# Patient Record
Sex: Female | Born: 1950 | Race: White | Hispanic: No | Marital: Married | Smoking: Former smoker
Health system: Southern US, Community
[De-identification: ages and names within clinical notes are randomized; demographics above are authoritative.]

## PROBLEM LIST (undated history)

## (undated) DIAGNOSIS — T8859XA Other complications of anesthesia, initial encounter: Secondary | ICD-10-CM

## (undated) HISTORY — PX: TONSILLECTOMY: SUR1361

## (undated) HISTORY — PX: OTHER SURGICAL HISTORY: SHX169

---

## 2020-12-01 ENCOUNTER — Other Ambulatory Visit (HOSPITAL_COMMUNITY): Payer: Self-pay | Admitting: Internal Medicine

## 2020-12-01 DIAGNOSIS — Z13820 Encounter for screening for osteoporosis: Secondary | ICD-10-CM

## 2020-12-01 DIAGNOSIS — Z1231 Encounter for screening mammogram for malignant neoplasm of breast: Secondary | ICD-10-CM

## 2020-12-13 ENCOUNTER — Other Ambulatory Visit: Payer: Self-pay

## 2020-12-13 ENCOUNTER — Ambulatory Visit (HOSPITAL_COMMUNITY)
Admission: RE | Admit: 2020-12-13 | Discharge: 2020-12-13 | Disposition: A | Discharge status: Home / Self Care | Payer: Medicare Other | Source: Ambulatory Visit | Attending: Internal Medicine | Admitting: Internal Medicine

## 2020-12-13 DIAGNOSIS — Z780 Asymptomatic menopausal state: Secondary | ICD-10-CM | POA: Insufficient documentation

## 2020-12-13 DIAGNOSIS — Z1231 Encounter for screening mammogram for malignant neoplasm of breast: Secondary | ICD-10-CM | POA: Diagnosis not present

## 2020-12-13 DIAGNOSIS — Z13820 Encounter for screening for osteoporosis: Secondary | ICD-10-CM

## 2020-12-13 DIAGNOSIS — M810 Age-related osteoporosis without current pathological fracture: Secondary | ICD-10-CM | POA: Diagnosis not present

## 2021-01-04 ENCOUNTER — Inpatient Hospital Stay
Admission: RE | Admit: 2021-01-04 | Discharge: 2021-01-04 | Disposition: A | Discharge status: Home / Self Care | Payer: Self-pay | Source: Ambulatory Visit | Attending: Internal Medicine | Admitting: Internal Medicine

## 2021-01-04 ENCOUNTER — Other Ambulatory Visit (HOSPITAL_COMMUNITY): Payer: Self-pay | Admitting: Internal Medicine

## 2021-01-04 DIAGNOSIS — Z1231 Encounter for screening mammogram for malignant neoplasm of breast: Secondary | ICD-10-CM

## 2021-01-30 ENCOUNTER — Encounter (HOSPITAL_COMMUNITY)
Admission: RE | Admit: 2021-01-30 | Discharge: 2021-01-30 | Disposition: A | Discharge status: Home / Self Care | Payer: Medicare Other | Source: Ambulatory Visit | Attending: Internal Medicine | Admitting: Internal Medicine

## 2021-01-30 ENCOUNTER — Other Ambulatory Visit: Payer: Self-pay

## 2021-01-30 DIAGNOSIS — M810 Age-related osteoporosis without current pathological fracture: Secondary | ICD-10-CM | POA: Insufficient documentation

## 2021-01-30 MED ORDER — DENOSUMAB 60 MG/ML ~~LOC~~ SOSY: 60.0000 mg | PREFILLED_SYRINGE | Freq: Once | SUBCUTANEOUS | Status: DC

## 2021-01-30 MED ORDER — DENOSUMAB 60 MG/ML ~~LOC~~ SOSY
PREFILLED_SYRINGE | SUBCUTANEOUS | Status: AC
  Administered 2021-01-30: 60 mg via SUBCUTANEOUS
  Filled 2021-01-30: qty 1

## 2021-01-30 NOTE — Discharge Instructions (Signed)
Information literature given to patient , along with consent obtained.

## 2021-06-27 ENCOUNTER — Encounter: Payer: Self-pay | Admitting: Internal Medicine

## 2021-07-31 ENCOUNTER — Other Ambulatory Visit: Payer: Self-pay

## 2021-07-31 ENCOUNTER — Ambulatory Visit (INDEPENDENT_AMBULATORY_CARE_PROVIDER_SITE_OTHER): Payer: Self-pay | Admitting: *Deleted

## 2021-07-31 VITALS — Ht 64.0 in | Wt 130.6 lb

## 2021-07-31 DIAGNOSIS — Z86010 Personal history of colonic polyps: Secondary | ICD-10-CM

## 2021-07-31 NOTE — Progress Notes (Signed)
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

## 2021-07-31 NOTE — Progress Notes (Addendum)
Gastroenterology Pre-Procedure Review  Request Date: 07/31/2021 Requesting Physician: Valentino Nose, NP, Last TCS done 12/19/2016 at [facility], internal hemorrhoids, tubular adenoma, 5 year repeat recommended  PATIENT REVIEW QUESTIONS: The patient responded to the following health history questions as indicated:    1. Diabetes Melitis: no 2. Joint replacements in the past 12 months: no 3. Major health problems in the past 3 months: no 4. Has an artificial valve or MVP: no 5. Has a defibrillator: no 6. Has been advised in past to take antibiotics in advance of a procedure like teeth cleaning: no 7. Family history of colon cancer: no  8. Alcohol Use: yes, 1 beer daily 9. Illicit drug Use: no 10. History of sleep apnea: no  11. History of coronary artery or other vascular stents placed within the last 12 months: no 12. History of any prior anesthesia complications: yes, fever next day and sedates easily 13. Body mass index is 22.42 kg/m.    MEDICATIONS & ALLERGIES:    Patient reports the following regarding taking any blood thinners:   Plavix? no Aspirin? Yes, 81 mg Coumadin? no Brilinta? no Xarelto? no Eliquis? no Pradaxa? no Savaysa? no Effient? no  Patient confirms/reports the following medications:  Current Outpatient Medications  Medication Sig Dispense Refill   aspirin EC 81 MG tablet Take 81 mg by mouth daily. Swallow whole.     b complex vitamins capsule Take 1 capsule by mouth daily.     Bempedoic Acid-Ezetimibe (NEXLIZET) 180-10 MG TABS Take 1 tablet by mouth daily.     Calcium Carb-Cholecalciferol (CALCIUM 600 + D PO) Take by mouth daily at 6 (six) AM.     cholecalciferol (VITAMIN D3) 25 MCG (1000 UNIT) tablet Take 1,000 Units by mouth daily.     denosumab (PROLIA) 60 MG/ML SOSY injection Inject 60 mg into the skin every 6 (six) months.     No current facility-administered medications for this visit.    Patient confirms/reports the  following allergies:  Allergies  Allergen Reactions   Compazine [Prochlorperazine] Other (See Comments)    Passed out    No orders of the defined types were placed in this encounter.   AUTHORIZATION INFORMATION Primary Insurance: Medicare,  ID #: [id number] Pre-Cert / Auth required: No, not required  Secondary Insurance: [place],  [place] #: [id number] Pre-Cert / Josem Kaufmann required: No, not required  SCHEDULE INFORMATION: Procedure has been scheduled as follows:  Date: 09/14/2021, Time:  9:00 Location: [facility] with Dr. Abbey Chatters  This Gastroenterology Pre-Precedure Review Form is being routed to the following provider(s): Neil Crouch, PA-C

## 2021-07-31 NOTE — Progress Notes (Signed)
Pt is requesting to have Miralax prep.

## 2021-08-01 ENCOUNTER — Encounter (HOSPITAL_COMMUNITY): Payer: Self-pay

## 2021-08-01 ENCOUNTER — Encounter (HOSPITAL_COMMUNITY)
Admission: RE | Admit: 2021-08-01 | Discharge: 2021-08-01 | Disposition: A | Discharge status: Home / Self Care | Payer: Medicare Other | Source: Ambulatory Visit | Attending: Internal Medicine | Admitting: Internal Medicine

## 2021-08-01 DIAGNOSIS — M810 Age-related osteoporosis without current pathological fracture: Secondary | ICD-10-CM | POA: Diagnosis present

## 2021-08-01 MED ORDER — DENOSUMAB 60 MG/ML ~~LOC~~ SOSY
60.0000 mg | PREFILLED_SYRINGE | Freq: Once | SUBCUTANEOUS | Status: AC
  Administered 2021-08-01: 08:00:00 60 mg via SUBCUTANEOUS
  Filled 2021-08-01: qty 1

## 2021-08-07 NOTE — Progress Notes (Signed)
Ideally would choose FDA approved bowel prep for colonoscopy. See if she is willing to do Clenpiq, maybe given her a sample.

## 2021-08-08 NOTE — Progress Notes (Signed)
Ok to schedule.  ASA II 

## 2021-08-09 NOTE — Progress Notes (Signed)
Lmom for pt to call me back. 

## 2021-08-21 ENCOUNTER — Telehealth: Payer: Self-pay | Admitting: *Deleted

## 2021-08-21 NOTE — Telephone Encounter (Signed)
PATIENT RETURNED YOUR CALL

## 2021-08-23 ENCOUNTER — Encounter: Payer: Self-pay | Admitting: *Deleted

## 2021-08-23 NOTE — Progress Notes (Signed)
Records release was faxed over on 07/31/2021.  Called and left voice message for medical records to follow up.

## 2021-08-23 NOTE — Telephone Encounter (Signed)
Spoke to pt.  She scheduled procedure for 09/14/2021 with arrival at 7:30.  Pt agreeable to Clenpiq.  Pt is coming by office to get kit and instructions today. °

## 2021-08-23 NOTE — Progress Notes (Signed)
Spoke to pt.  She scheduled procedure for 09/14/2021 with arrival at 7:30.  Pt agreeable to Clenpiq.  Pt is coming by office to get kit and instructions today. °

## 2021-08-27 NOTE — Progress Notes (Signed)
Received records and updated information in triage.  Will route to provider who signed off for review.

## 2021-09-03 NOTE — Progress Notes (Signed)
Updated info noted. Ok to proceed as before.

## 2021-09-14 ENCOUNTER — Encounter (HOSPITAL_COMMUNITY)
Admission: RE | Disposition: A | Discharge status: Home / Self Care | Payer: Self-pay | Source: Home / Self Care | Attending: Internal Medicine

## 2021-09-14 ENCOUNTER — Encounter (HOSPITAL_COMMUNITY): Payer: Self-pay

## 2021-09-14 ENCOUNTER — Ambulatory Visit (HOSPITAL_COMMUNITY)
Admission: RE | Admit: 2021-09-14 | Discharge: 2021-09-14 | Disposition: A | Discharge status: Home / Self Care | Payer: Medicare Other | Attending: Internal Medicine | Admitting: Internal Medicine

## 2021-09-14 ENCOUNTER — Ambulatory Visit (HOSPITAL_COMMUNITY): Payer: Medicare Other | Admitting: Anesthesiology

## 2021-09-14 ENCOUNTER — Other Ambulatory Visit: Payer: Self-pay

## 2021-09-14 DIAGNOSIS — K635 Polyp of colon: Secondary | ICD-10-CM | POA: Diagnosis not present

## 2021-09-14 DIAGNOSIS — Z09 Encounter for follow-up examination after completed treatment for conditions other than malignant neoplasm: Secondary | ICD-10-CM | POA: Diagnosis not present

## 2021-09-14 DIAGNOSIS — Q438 Other specified congenital malformations of intestine: Secondary | ICD-10-CM | POA: Insufficient documentation

## 2021-09-14 DIAGNOSIS — K648 Other hemorrhoids: Secondary | ICD-10-CM | POA: Insufficient documentation

## 2021-09-14 DIAGNOSIS — Z87891 Personal history of nicotine dependence: Secondary | ICD-10-CM | POA: Insufficient documentation

## 2021-09-14 DIAGNOSIS — Z86010 Personal history of colonic polyps: Secondary | ICD-10-CM

## 2021-09-14 DIAGNOSIS — D123 Benign neoplasm of transverse colon: Secondary | ICD-10-CM | POA: Insufficient documentation

## 2021-09-14 HISTORY — PX: COLONOSCOPY WITH PROPOFOL: SHX5780

## 2021-09-14 HISTORY — PX: POLYPECTOMY: SHX5525

## 2021-09-14 HISTORY — DX: Other complications of anesthesia, initial encounter: T88.59XA

## 2021-09-14 SURGERY — COLONOSCOPY WITH PROPOFOL
Anesthesia: General

## 2021-09-14 MED ORDER — LACTATED RINGERS IV SOLN: INTRAVENOUS | Status: DC

## 2021-09-14 MED ORDER — CHLORHEXIDINE GLUCONATE CLOTH 2 % EX PADS: 6.0000 | MEDICATED_PAD | Freq: Once | CUTANEOUS | Status: DC

## 2021-09-14 MED ORDER — LIDOCAINE HCL (CARDIAC) PF 100 MG/5ML IV SOSY
PREFILLED_SYRINGE | INTRAVENOUS | Status: DC | PRN
  Administered 2021-09-14: 50 mg via INTRAVENOUS

## 2021-09-14 MED ORDER — PROPOFOL 10 MG/ML IV BOLUS
INTRAVENOUS | Status: DC | PRN
Start: 2021-09-14 — End: 2021-09-14
  Administered 2021-09-14: 100 mg via INTRAVENOUS

## 2021-09-14 MED ORDER — PROPOFOL 500 MG/50ML IV EMUL
INTRAVENOUS | Status: DC | PRN
  Administered 2021-09-14: 150 ug/kg/min via INTRAVENOUS

## 2021-09-14 NOTE — Anesthesia Preprocedure Evaluation (Signed)
Anesthesia Evaluation  Patient identified by MRN, date of birth, ID band Patient awake    Reviewed: Allergy & Precautions, H&P , NPO status , Patient's Chart, lab work & pertinent test results, reviewed documented beta blocker date and time   Airway Mallampati: II  TM Distance: >3 FB Neck ROM: full    Dental no notable dental hx.    Pulmonary neg pulmonary ROS, former smoker,    Pulmonary exam normal breath sounds clear to auscultation       Cardiovascular Exercise Tolerance: Good negative cardio ROS   Rhythm:regular Rate:Normal     Neuro/Psych negative neurological ROS  negative psych ROS   GI/Hepatic negative GI ROS, Neg liver ROS,   Endo/Other  negative endocrine ROS  Renal/GU negative Renal ROS  negative genitourinary   Musculoskeletal   Abdominal   Peds  Hematology negative hematology ROS (+)   Anesthesia Other Findings   Reproductive/Obstetrics negative OB ROS                             Anesthesia Physical Anesthesia Plan  ASA: 2  Anesthesia Plan: General   Post-op Pain Management:    Induction:   PONV Risk Score and Plan: Propofol infusion  Airway Management Planned:   Additional Equipment:   Intra-op Plan:   Post-operative Plan:   Informed Consent: I have reviewed the patients History and Physical, chart, labs and discussed the procedure including the risks, benefits and alternatives for the proposed anesthesia with the patient or authorized representative who has indicated his/her understanding and acceptance.     Dental Advisory Given  Plan Discussed with: CRNA  Anesthesia Plan Comments:         Anesthesia Quick Evaluation  

## 2021-09-14 NOTE — Anesthesia Postprocedure Evaluation (Signed)
Anesthesia Post Note  Patient: Krista Cabrera  Procedure(s) Performed: COLONOSCOPY WITH PROPOFOL POLYPECTOMY  Patient location during evaluation: Phase II Anesthesia Type: General Level of consciousness: awake Pain management: pain level controlled Vital Signs Assessment: post-procedure vital signs reviewed and stable Respiratory status: spontaneous breathing and respiratory function stable Cardiovascular status: blood pressure returned to baseline and stable Postop Assessment: no headache and no apparent nausea or vomiting Anesthetic complications: no Comments: Late entry   No notable events documented.   Last Vitals:  Vitals:   09/14/21 0753 09/14/21 0934  BP: 135/79 114/62  Pulse: 74   Resp: 12 11  Temp: 36.4 C (!) 36.3 C  SpO2: 99% 98%    Last Pain:  Vitals:   09/14/21 0934  TempSrc: Oral  PainSc: 0-No pain                 Louann Sjogren

## 2021-09-14 NOTE — Discharge Instructions (Addendum)
°  Colonoscopy Discharge Instructions  Read the instructions outlined below and refer to this sheet in the next few weeks. These discharge instructions provide you with general information on caring for yourself after you leave the hospital. Your doctor may also give you specific instructions. While your treatment has been planned according to the most current medical practices available, unavoidable complications occasionally occur.   ACTIVITY You may resume your regular activity, but move at a slower pace for the next 24 hours.  Take frequent rest periods for the next 24 hours.  Walking will help get rid of the air and reduce the bloated feeling in your belly (abdomen).  No driving for 24 hours (because of the medicine (anesthesia) used during the test).   Do not sign any important legal documents or operate any machinery for 24 hours (because of the anesthesia used during the test).  NUTRITION Drink plenty of fluids.  You may resume your normal diet as instructed by your doctor.  Begin with a light meal and progress to your normal diet. Heavy or fried foods are harder to digest and may make you feel sick to your stomach (nauseated).  Avoid alcoholic beverages for 24 hours or as instructed.  MEDICATIONS You may resume your normal medications unless your doctor tells you otherwise.  WHAT YOU CAN EXPECT TODAY Some feelings of bloating in the abdomen.  Passage of more gas than usual.  Spotting of blood in your stool or on the toilet paper.  IF YOU HAD POLYPS REMOVED DURING THE COLONOSCOPY: No aspirin products for 7 days or as instructed.  No alcohol for 7 days or as instructed.  Eat a soft diet for the next 24 hours.  FINDING OUT THE RESULTS OF YOUR TEST Not all test results are available during your visit. If your test results are not back during the visit, make an appointment with your caregiver to find out the results. Do not assume everything is normal if you have not heard from your  caregiver or the medical facility. It is important for you to follow up on all of your test results.  SEEK IMMEDIATE MEDICAL ATTENTION IF: You have more than a spotting of blood in your stool.  Your belly is swollen (abdominal distention).  You are nauseated or vomiting.  You have a temperature over 101.  You have abdominal pain or discomfort that is severe or gets worse throughout the day.   Your colonoscopy revealed 1 polyp(s) which I removed successfully. Await pathology results, my office will contact you. I recommend repeating colonoscopy in 5 years for surveillance purposes. Otherwise follow up with GI as needed.    I hope you have a great rest of your week!  Charles K. Carver, D.O. Gastroenterology and Hepatology [facility]  

## 2021-09-14 NOTE — Op Note (Signed)
[facility] Patient Name: Krista Cabrera Procedure Date: 09/14/2021 8:58 AM MRN: [id number] Date of Birth: 1950/12/14 Attending MD: Elon Alas. Edgar Frisk CSN: [id number] Age: 71 Admit Type: Outpatient Procedure:                Colonoscopy Indications:              High risk colon cancer surveillance: Personal                            history of colonic polyps Providers:                Elon Alas. Abbey Chatters, DO, Lambert Mody, Hughie Closs RN, RN, Randa Spike, Technician Referring MD:              Medicines:                See the Anesthesia note for documentation of the                            administered medications Complications:            No immediate complications. Estimated Blood Loss:     Estimated blood loss was minimal. Procedure:                Pre-Anesthesia Assessment:                           - The anesthesia plan was to use monitored                            anesthesia care (MAC).                           After obtaining informed consent, the colonoscope                            was passed under direct vision. Throughout the                            procedure, the patient's blood pressure, pulse, and                            oxygen saturations were monitored continuously. The                            PCF-HQ190L ([id number]) scope was introduced through                            the anus and advanced to the the cecum, identified                            by appendiceal orifice and ileocecal valve. The                            colonoscopy was technically difficult  and complex                            due to a tortuous colon. The patient tolerated the                            procedure well. The quality of the bowel                            preparation was evaluated using the BBPS [facility]                            Bowel Preparation Scale) with scores of: Right                            Colon = 3, Transverse  Colon = 3 and Left Colon = 3                            (entire mucosa seen well with no residual staining,                            small fragments of stool or opaque liquid). The                            total BBPS score equals 9. Scope In: 9:09:50 AM Scope Out: 9:32:05 AM Scope Withdrawal Time: 0 hours 8 minutes 28 seconds  Total Procedure Duration: 0 hours 22 minutes 15 seconds  Findings:      The perianal and digital rectal examinations were normal.      Non-bleeding internal hemorrhoids were found during endoscopy.      A 2 mm polyp was found in the transverse colon. The polyp was sessile.       The polyp was removed with a cold biopsy forceps. Resection and       retrieval were complete.      The exam was otherwise without abnormality. Impression:               - Non-bleeding internal hemorrhoids.                           - One 2 mm polyp in the transverse colon, removed                            with a cold biopsy forceps. Resected and retrieved.                           - The examination was otherwise normal. Moderate Sedation:      Per Anesthesia Care Recommendation:           - Patient has a contact number available for                            emergencies. The signs and symptoms of potential  delayed complications were discussed with the                            patient. Return to normal activities tomorrow.                            Written discharge instructions were provided to the                            patient.                           - Resume previous diet.                           - Continue present medications.                           - Await pathology results.                           - Repeat colonoscopy in 5 years for surveillance.                           - Return to GI clinic PRN. Procedure Code(s):        --- Professional ---                           [phone], Colonoscopy, flexible; with biopsy, single                             or multiple Diagnosis Code(s):        --- Professional ---                           Z86.010, Personal history of colonic polyps                           K63.5, Polyp of colon                           K64.8, Other hemorrhoids CPT copyright 2019 American Medical Association. All rights reserved. The codes documented in this report are preliminary and upon coder review may  be revised to meet current compliance requirements. Elon Alas. Abbey Chatters, DO [place] Abbey Chatters, DO 09/14/2021 9:33:55 AM This report has been signed electronically. Number of Addenda: 0

## 2021-09-14 NOTE — H&P (Signed)
Primary Care Physician:  Celene Squibb, MD Primary Gastroenterologist:  Dr. Abbey Chatters  Pre-Procedure History & Physical: HPI:  Krista Cabrera is a 70 y.o. female is here for a colonoscopy to be performed for surveillance purposes. Last TCS done 12/19/2016 at [facility], internal hemorrhoids, tubular adenoma, 5 year repeat recommended.  Past Medical History:  Diagnosis Date   Complication of anesthesia     Past Surgical History:  Procedure Laterality Date   right carpal tunnel release     skin cancer removed from nose     TONSILLECTOMY      Prior to Admission medications   Medication Sig Start Date End Date Taking? Authorizing Provider  aspirin EC 81 MG tablet Take 81 mg by mouth daily. Swallow whole.   Yes [provider]  b complex vitamins capsule Take 1 capsule by mouth daily.   Yes [provider]  Bempedoic Acid-Ezetimibe (NEXLIZET) 180-10 MG TABS Take 1 tablet by mouth daily.   Yes [provider]  calcium carbonate (OS-CAL) 600 MG TABS tablet Take 600 mg by mouth daily.   Yes [provider]  cholecalciferol (VITAMIN D3) 25 MCG (1000 UNIT) tablet Take 1,000 Units by mouth daily.   Yes [provider]  magnesium gluconate (MAGONATE) 500 MG tablet Take 500 mg by mouth daily.   Yes [provider]  denosumab (PROLIA) 60 MG/ML SOSY injection Inject 60 mg into the skin every 6 (six) months.    [provider]    Allergies as of 08/23/2021 - Review Complete 08/01/2021  Allergen Reaction Noted   Compazine [prochlorperazine] Other (See Comments) 01/30/2021    Family History  Problem Relation Age of Onset   Colon cancer Other     Social History   Socioeconomic History   Marital status: Married    Spouse name: Not on file   Number of children: Not on file   Years of education: Not on file   Highest education level: Not on file  Occupational History   Not on file  Tobacco Use   Smoking  status: Former    Types: Cigarettes   Smokeless tobacco: Never  Vaping Use   Vaping Use: Never used  Substance and Sexual Activity   Alcohol use: Yes    Alcohol/week: 7.0 standard drinks    Types: 7 Cans of beer per week   Drug use: Not Currently   Sexual activity: Not on file  Other Topics Concern   Not on file  Social History Narrative   Not on file   Social Determinants of Health   Financial Resource Strain: Not on file  Food Insecurity: Not on file  Transportation Needs: Not on file  Physical Activity: Not on file  Stress: Not on file  Social Connections: Not on file  Intimate Partner Violence: Not on file    Review of Systems: See HPI, otherwise negative ROS  Physical Exam: Vital signs in last 24 hours: Temp:  [97.6 F (36.4 C)] 97.6 F (36.4 C) (01/27 0753) Pulse Rate:  [74] 74 (01/27 0753) Resp:  [12] 12 (01/27 0753) BP: (135)/(79) 135/79 (01/27 0753) SpO2:  [99 %] 99 % (01/27 0753) Weight:  [54.9 kg] 54.9 kg (01/27 0753)   General:   Alert,  Well-developed, well-nourished, pleasant and cooperative in NAD Head:  Normocephalic and atraumatic. Eyes:  Sclera clear, no icterus.   Conjunctiva pink. Ears:  Normal auditory acuity. Nose:  No deformity, discharge,  or lesions. Mouth:  No deformity  or lesions, dentition normal. Neck:  Supple; no masses or thyromegaly. Lungs:  Clear throughout to auscultation.   No wheezes, crackles, or rhonchi. No acute distress. Heart:  Regular rate and rhythm; no murmurs, clicks, rubs,  or gallops. Abdomen:  Soft, nontender and nondistended. No masses, hepatosplenomegaly or hernias noted. Normal bowel sounds, without guarding, and without rebound.   Msk:  Symmetrical without gross deformities. Normal posture. Extremities:  Without clubbing or edema. Neurologic:  Alert and  oriented x4;  grossly normal neurologically. Skin:  Intact without significant lesions or rashes. Cervical Nodes:  No significant cervical adenopathy. Psych:   Alert and cooperative. Normal mood and affect.  Impression/Plan: Krista Cabrera is here for a colonoscopy to be performed for surveillance purposes. Last TCS done 12/19/2016 at [facility], internal hemorrhoids, tubular adenoma, 5 year repeat recommended  The risks of the procedure including infection, bleed, or perforation as well as benefits, limitations, alternatives and imponderables have been reviewed with the patient. Questions have been answered. All parties agreeable.

## 2021-09-14 NOTE — Transfer of Care (Signed)
Immediate Anesthesia Transfer of Care Note  Patient: Krista Cabrera  Procedure(s) Performed: COLONOSCOPY WITH PROPOFOL POLYPECTOMY  Patient Location: PACU  Anesthesia Type:General  Level of Consciousness: awake and alert   Airway & Oxygen Therapy: Patient Spontanous Breathing  Post-op Assessment: Report given to RN and Post -op Vital signs reviewed and stable  Post vital signs: Reviewed and stable  Last Vitals:  Vitals Value Taken Time  BP    Temp    Pulse    Resp    SpO2      Last Pain:  Vitals:   09/14/21 0906  TempSrc:   PainSc: 0-No pain      Patients Stated Pain Goal: 6 (62/26/33 3545)  Complications: No notable events documented.

## 2021-09-17 LAB — SURGICAL PATHOLOGY

## 2021-09-18 ENCOUNTER — Encounter (HOSPITAL_COMMUNITY): Payer: Self-pay | Admitting: Internal Medicine

## 2021-12-19 IMAGING — MG MM DIGITAL SCREENING BILAT W/ TOMO AND CAD
8 series · 9 of 24 positions shown · non-contrast
Comparison: Previous exam(s).

CLINICAL DATA: Screening.

EXAM:
DIGITAL SCREENING BILATERAL MAMMOGRAM WITH TOMOSYNTHESIS AND CAD
TECHNIQUE: Bilateral screening digital craniocaudal and mediolateral oblique
mammograms were obtained. Bilateral screening digital breast
tomosynthesis was performed. The images were evaluated with
computer-aided detection.

[R MLO synth-2D]
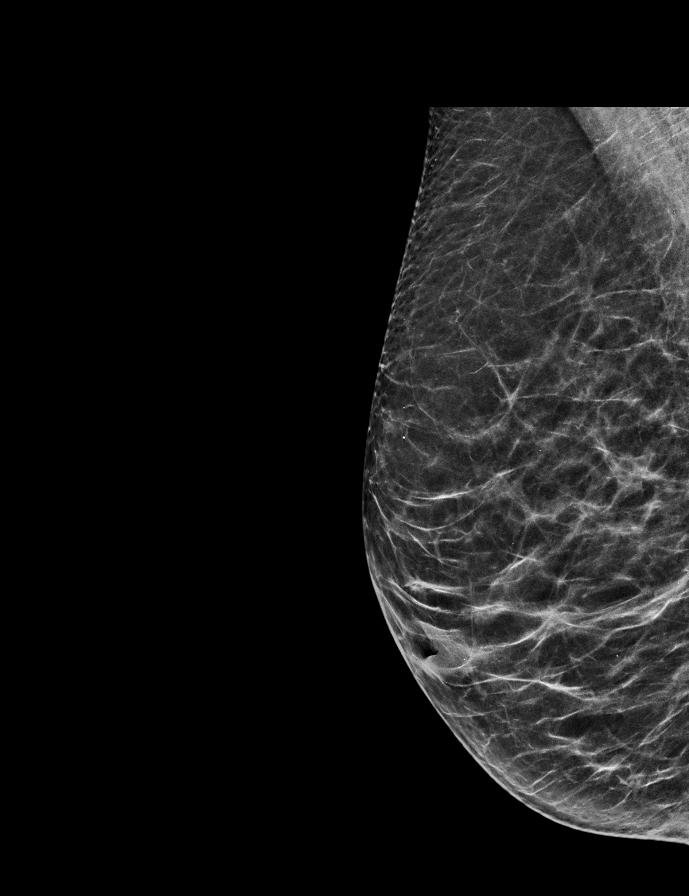

[R CC synth-2D]
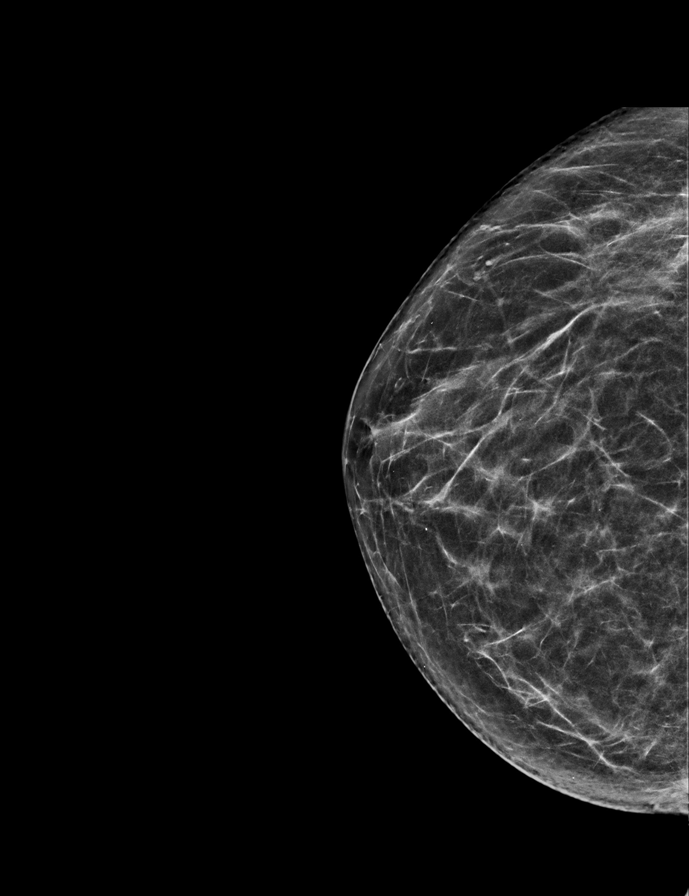

[L MLO synth-2D]
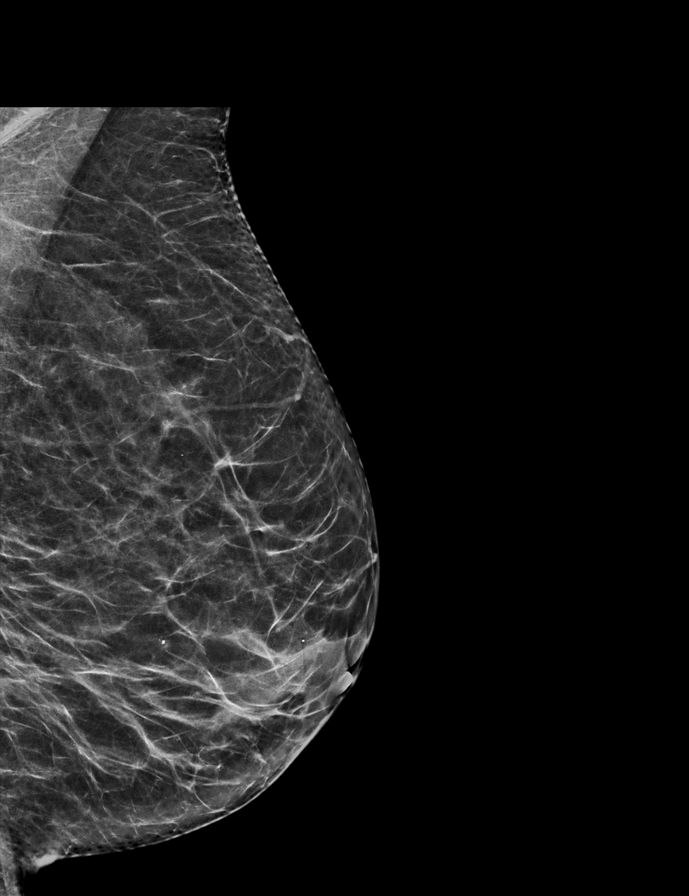

[L CC synth-2D]
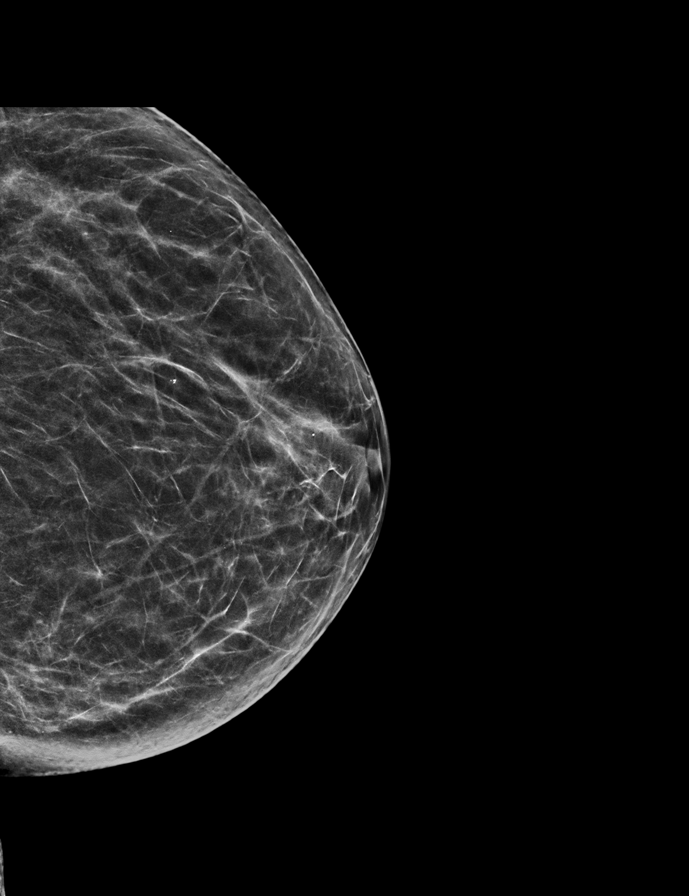

[L MLO tomo · 2 of 59 frames shown]
[frame 20/59]
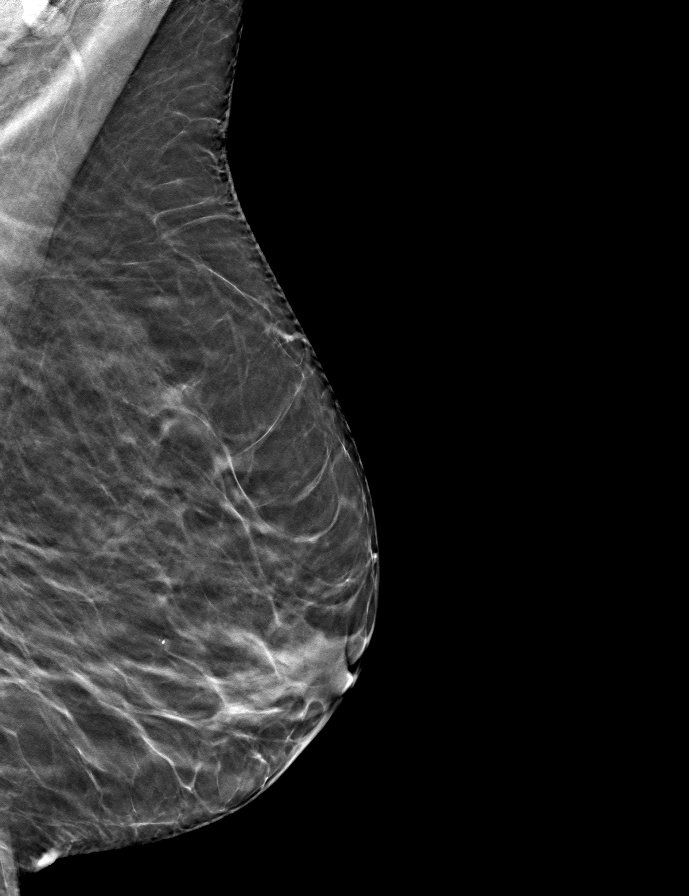
[frame 30/59]
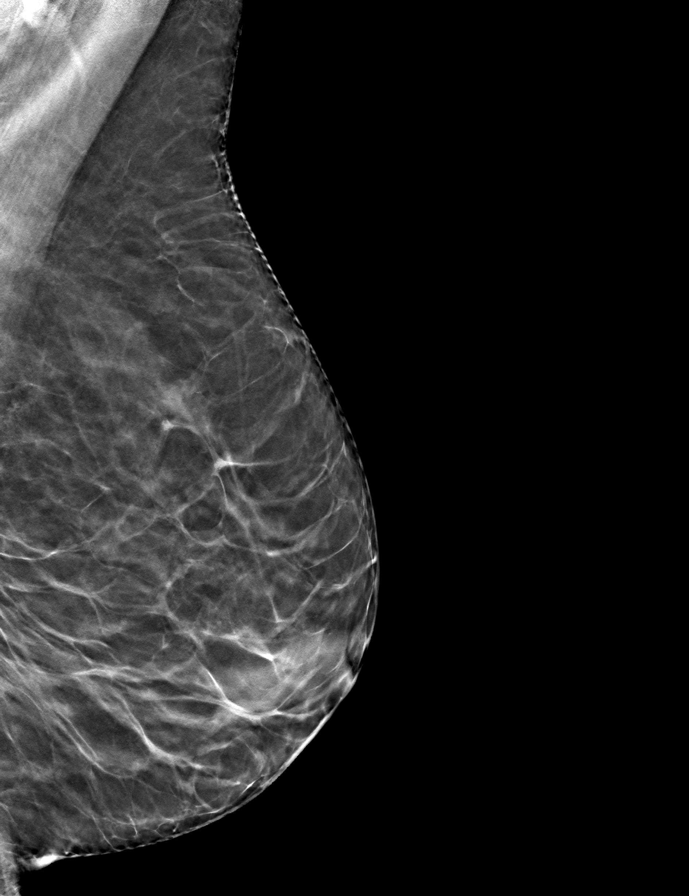

[R MLO tomo · tomo slice 28/55.0]
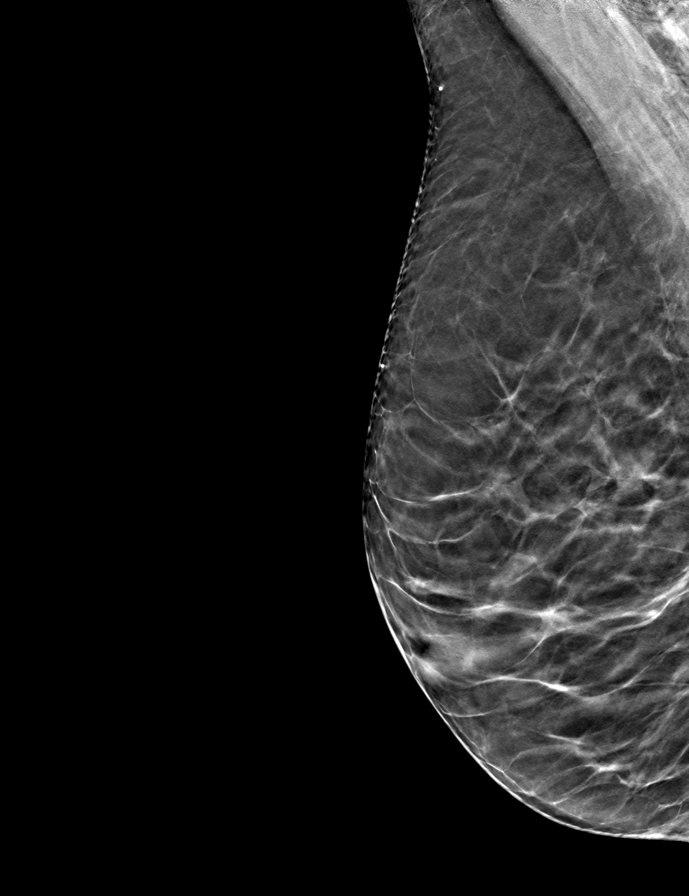

[L CC tomo · tomo slice 29/58.0]
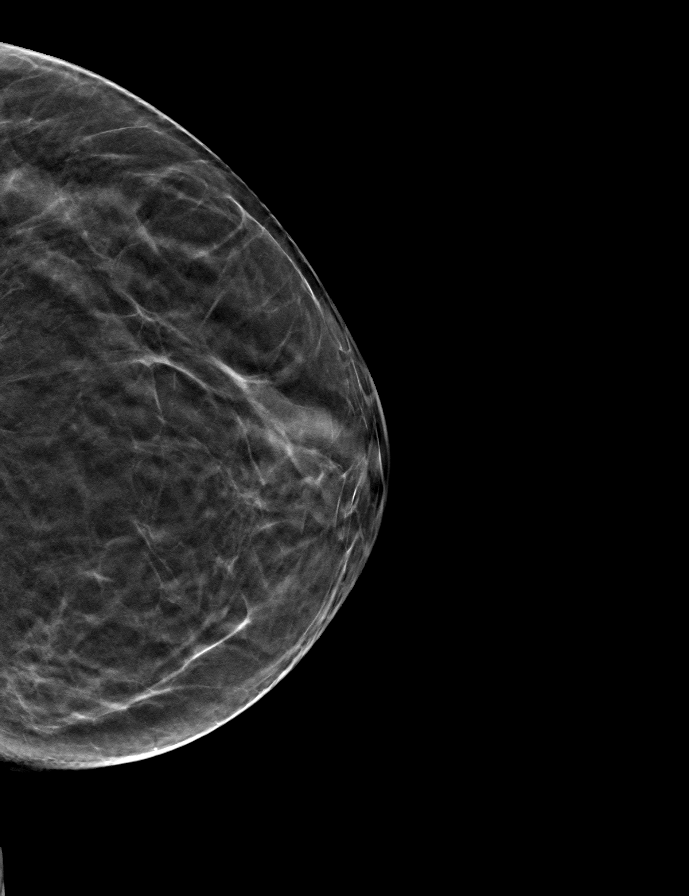

[R CC tomo · tomo slice 30/59.0]
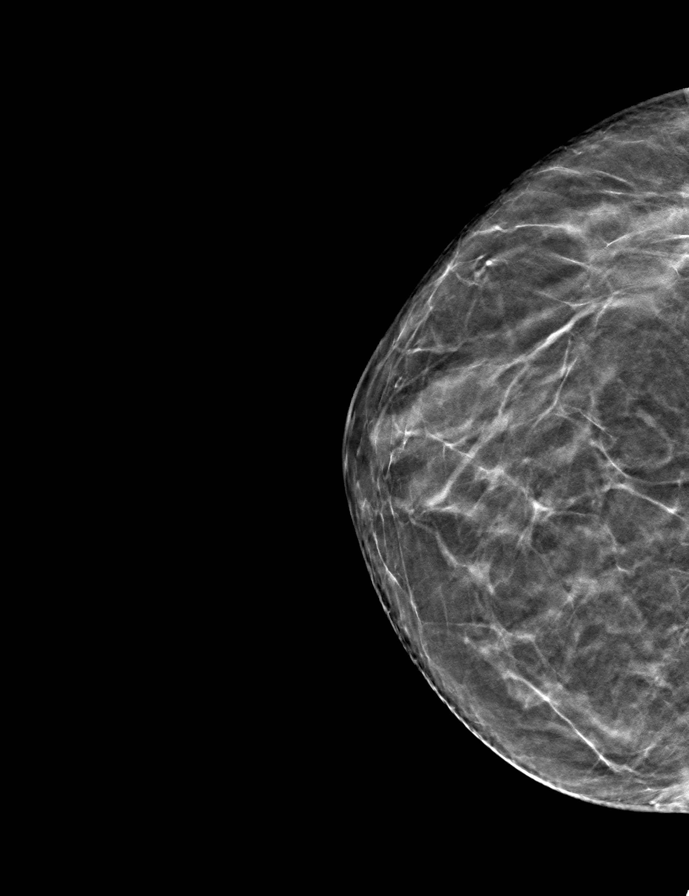

[9 of 24 positions shown; findings below may reference images not displayed]

ACR Breast Density Category b: There are scattered areas of
fibroglandular density.
FINDINGS: There are no findings suspicious for malignancy. The images were
evaluated with computer-aided detection.
IMPRESSION: No mammographic evidence of malignancy. A result letter of this
screening mammogram will be mailed directly to the patient.

RECOMMENDATION:
Screening mammogram in one year. (Code:WJ-I-BG6)

BI-RADS CATEGORY  1: Negative.

## 2022-01-30 ENCOUNTER — Encounter (HOSPITAL_COMMUNITY): Admission: RE | Admit: 2022-01-30 | Payer: Medicare Other | Source: Ambulatory Visit

## 2022-03-08 ENCOUNTER — Telehealth: Payer: Self-pay | Admitting: Pharmacy Technician

## 2022-03-08 NOTE — Telephone Encounter (Signed)
ERROR

## 2022-03-11 ENCOUNTER — Telehealth: Payer: Self-pay | Admitting: Pharmacy Technician

## 2022-03-11 ENCOUNTER — Other Ambulatory Visit: Payer: Self-pay

## 2022-03-11 DIAGNOSIS — M810 Age-related osteoporosis without current pathological fracture: Secondary | ICD-10-CM | POA: Insufficient documentation

## 2022-03-11 NOTE — Telephone Encounter (Addendum)
Auth Submission: no auth needed Payer: medicare a/b & aarp Medication & CPT/J Code(s) submitted: Prolia (Denosumab) [id number] Route of submission (phone, fax, portal): phone Auth type: Buy/Bill Units/visits requested: x1 dose Reference number: [id number] Approval from: 03/11/22 to 08/18/22   Medicare coverage reviewed and approval extended to 08/19/23

## 2022-03-12 ENCOUNTER — Ambulatory Visit (INDEPENDENT_AMBULATORY_CARE_PROVIDER_SITE_OTHER): Payer: Medicare Other

## 2022-03-12 VITALS — BP 128/75 | HR 63 | Temp 98.1°F | Resp 18 | Ht 64.0 in | Wt 122.0 lb

## 2022-03-12 DIAGNOSIS — M810 Age-related osteoporosis without current pathological fracture: Secondary | ICD-10-CM

## 2022-03-12 MED ORDER — DENOSUMAB 60 MG/ML ~~LOC~~ SOSY
60.0000 mg | PREFILLED_SYRINGE | Freq: Once | SUBCUTANEOUS | Status: AC
  Administered 2022-03-12: 60 mg via SUBCUTANEOUS

## 2022-03-12 NOTE — Progress Notes (Signed)
Diagnosis: Osteoporosis  Provider:  Praveen Mannam, MD  Procedure: Injection  Prolia (Denosumab), Dose: 60 mg, Site: subcutaneous, Number of injections: 1  Discharge: Condition: Good, Destination: Home . AVS provided to patient.   Performed by:  Haynes Giannotti E Calee Nugent, LPN       

## 2022-05-01 ENCOUNTER — Other Ambulatory Visit (HOSPITAL_COMMUNITY): Payer: Self-pay | Admitting: Family Medicine

## 2022-05-01 DIAGNOSIS — M810 Age-related osteoporosis without current pathological fracture: Secondary | ICD-10-CM

## 2022-06-11 ENCOUNTER — Other Ambulatory Visit: Payer: Self-pay

## 2022-08-15 ENCOUNTER — Ambulatory Visit (INDEPENDENT_AMBULATORY_CARE_PROVIDER_SITE_OTHER): Payer: Medicare Other

## 2022-08-15 ENCOUNTER — Ambulatory Visit (INDEPENDENT_AMBULATORY_CARE_PROVIDER_SITE_OTHER): Payer: Medicare Other | Admitting: Orthopedic Surgery

## 2022-08-15 ENCOUNTER — Encounter: Payer: Self-pay | Admitting: Orthopedic Surgery

## 2022-08-15 VITALS — BP 138/80 | HR 88 | Ht 64.0 in | Wt 120.0 lb

## 2022-08-15 DIAGNOSIS — G8929 Other chronic pain: Secondary | ICD-10-CM

## 2022-08-15 DIAGNOSIS — M25512 Pain in left shoulder: Secondary | ICD-10-CM | POA: Diagnosis not present

## 2022-08-15 DIAGNOSIS — M75122 Complete rotator cuff tear or rupture of left shoulder, not specified as traumatic: Secondary | ICD-10-CM | POA: Diagnosis not present

## 2022-08-15 MED ORDER — METHYLPREDNISOLONE ACETATE 40 MG/ML IJ SUSP
40.0000 mg | Freq: Once | INTRAMUSCULAR | Status: AC
  Administered 2022-08-15: 40 mg via INTRA_ARTICULAR

## 2022-08-15 NOTE — Progress Notes (Signed)
Chief Complaint  Patient presents with   Shoulder Pain    Left/ states she has had pain for 3 months since she got a flu shot, has gone for physical therapy had increased motion with therapy, patient states she does not have frozen shoulder, she is able to move it  but it is painful     Patient referred to [place] by have any form  She is a 71 year old female got a flu shot in September since that time she has had pain in her left arm and shoulder decreased range of motion despite going to physical therapy  She can lift her arm more than 45 degrees actively she can abduct more than 60 degrees and cannot reach her back pocket  The pain is in the upper arm anterior and lateral shoulder increases with attempts at abduction and flexion  Review of systems is negative  Physical Exam Vitals and nursing note reviewed.  Constitutional:      Appearance: Normal appearance.  HENT:     Head: Normocephalic and atraumatic.  Eyes:     General: No scleral icterus.       Right eye: No discharge.        Left eye: No discharge.     Extraocular Movements: Extraocular movements intact.     Conjunctiva/sclera: Conjunctivae normal.     Pupils: Pupils are equal, round, and reactive to light.  Cardiovascular:     Rate and Rhythm: Normal rate.     Pulses: Normal pulses.  Skin:    General: Skin is warm and dry.     Capillary Refill: Capillary refill takes less than 2 seconds.  Neurological:     General: No focal deficit present.     Mental Status: She is alert and oriented to person, place, and time.  Psychiatric:        Mood and Affect: Mood normal.        Behavior: Behavior normal.        Thought Content: Thought content normal.        Judgment: Judgment normal.    Active and passive motion assessment  She has normal external rotation with the arm at the side she can flex and abduct 45 and 60 degrees she cannot reach her back pocket passively I can get the arm to about 90 degrees is  painful Nontender over the biceps upper arm posterior shoulder joint line some tenderness anterolateral acromial deltoid area.  I could not get her in the abduction external rotation position  Imaging was normal.  I read the x-ray  Encounter Diagnoses  Name Primary?   Chronic left shoulder pain Yes   Nontraumatic complete tear of left rotator cuff      Subacromial injection  Procedure note the subacromial injection shoulder left   Verbal consent was obtained to inject the  Left   Shoulder  Timeout was completed to confirm the injection site is a subacromial space of the  left  shoulder  Medication used Depo-Medrol 40 mg and lidocaine 1% 3 cc  Anesthesia was provided by ethyl chloride  The injection was performed in the left  posterior subacromial space. After pinning the skin with alcohol and anesthetized the skin with ethyl chloride the subacromial space was injected using a 20-gauge needle. There were no complications  Sterile dressing was applied.  Unclear that if this is postinjection syndrome after vaccine or if she has an actual rotator cuff injury  Recommend MRI to rule out rotator cuff tear follow-up  after MRI

## 2022-08-15 NOTE — Patient Instructions (Signed)
For MRI please go ahead and call to schedule your appointment with [facility] within at least one (1) week.   Central Scheduling [phone]

## 2022-09-10 ENCOUNTER — Ambulatory Visit (HOSPITAL_COMMUNITY)
Admission: RE | Admit: 2022-09-10 | Discharge: 2022-09-10 | Disposition: A | Discharge status: Home / Self Care | Payer: Medicare Other | Source: Ambulatory Visit | Attending: Orthopedic Surgery | Admitting: Orthopedic Surgery

## 2022-09-10 DIAGNOSIS — M25512 Pain in left shoulder: Secondary | ICD-10-CM | POA: Diagnosis not present

## 2022-09-10 DIAGNOSIS — G8929 Other chronic pain: Secondary | ICD-10-CM | POA: Diagnosis present

## 2022-09-12 ENCOUNTER — Ambulatory Visit (INDEPENDENT_AMBULATORY_CARE_PROVIDER_SITE_OTHER): Payer: Medicare Other | Admitting: Orthopedic Surgery

## 2022-09-12 ENCOUNTER — Encounter: Payer: Self-pay | Admitting: Orthopedic Surgery

## 2022-09-12 ENCOUNTER — Encounter (HOSPITAL_COMMUNITY)
Admission: RE | Admit: 2022-09-12 | Discharge: 2022-09-12 | Disposition: A | Discharge status: Home / Self Care | Payer: Medicare Other | Source: Ambulatory Visit | Attending: Internal Medicine | Admitting: Internal Medicine

## 2022-09-12 ENCOUNTER — Ambulatory Visit: Payer: Medicare Other

## 2022-09-12 VITALS — BP 130/75 | HR 78 | Temp 98.2°F | Resp 16

## 2022-09-12 DIAGNOSIS — G8929 Other chronic pain: Secondary | ICD-10-CM | POA: Diagnosis not present

## 2022-09-12 DIAGNOSIS — M778 Other enthesopathies, not elsewhere classified: Secondary | ICD-10-CM

## 2022-09-12 DIAGNOSIS — M810 Age-related osteoporosis without current pathological fracture: Secondary | ICD-10-CM | POA: Insufficient documentation

## 2022-09-12 DIAGNOSIS — M25512 Pain in left shoulder: Secondary | ICD-10-CM | POA: Diagnosis not present

## 2022-09-12 MED ORDER — DENOSUMAB 60 MG/ML ~~LOC~~ SOSY
60.0000 mg | PREFILLED_SYRINGE | Freq: Once | SUBCUTANEOUS | Status: AC
  Administered 2022-09-12: 60 mg via SUBCUTANEOUS

## 2022-09-12 NOTE — Progress Notes (Signed)
Diagnosis: Osteoporosis  Provider:  Wende Neighbors MD  Procedure: Injection  Prolia (Denosumab), Dose: 60 mg, Site: subcutaneous, Number of injections: 1  Post Care: Patient declined observation  Discharge: Condition: Good, Destination: Home . AVS provided to patient.   Performed by:  Baxter Hire, RN

## 2022-09-12 NOTE — Progress Notes (Signed)
Chief Complaint  Patient presents with   Shoulder Pain    Left feels better after injection    Results    Here to review MRI left shoulder    72 year old female here to follow-up after her MRI and the injection we gave her.  She says the shoulder feels better and she says she can live with the shoulder the way it is  She exhibits full range of motion today  I reviewed her MRI there was a superior labral tear anterior to posterior as well as tendinosis of the rotator cuff but in my opinion she has a near full-thickness rotator cuff tear  Clinically she does not have a cuff tear.  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

## 2022-10-17 ENCOUNTER — Encounter: Payer: Self-pay | Admitting: Radiology

## 2022-11-01 ENCOUNTER — Encounter (HOSPITAL_COMMUNITY): Payer: Self-pay | Admitting: Family Medicine

## 2022-11-01 ENCOUNTER — Other Ambulatory Visit (HOSPITAL_COMMUNITY): Payer: Self-pay | Admitting: Family Medicine

## 2022-11-01 DIAGNOSIS — Z13820 Encounter for screening for osteoporosis: Secondary | ICD-10-CM

## 2022-11-01 DIAGNOSIS — Z1231 Encounter for screening mammogram for malignant neoplasm of breast: Secondary | ICD-10-CM

## 2022-11-01 DIAGNOSIS — F17201 Nicotine dependence, unspecified, in remission: Secondary | ICD-10-CM

## 2022-11-05 ENCOUNTER — Other Ambulatory Visit (HOSPITAL_COMMUNITY): Payer: Self-pay | Admitting: Family Medicine

## 2022-11-05 DIAGNOSIS — F17201 Nicotine dependence, unspecified, in remission: Secondary | ICD-10-CM

## 2022-12-16 ENCOUNTER — Ambulatory Visit (HOSPITAL_COMMUNITY)
Admission: RE | Admit: 2022-12-16 | Discharge: 2022-12-16 | Disposition: A | Discharge status: Home / Self Care | Payer: Medicare Other | Source: Ambulatory Visit | Attending: Family Medicine | Admitting: Family Medicine

## 2022-12-16 ENCOUNTER — Encounter (HOSPITAL_COMMUNITY): Payer: Self-pay

## 2022-12-16 ENCOUNTER — Other Ambulatory Visit (HOSPITAL_COMMUNITY): Payer: Self-pay | Admitting: Family Medicine

## 2022-12-16 DIAGNOSIS — Z13820 Encounter for screening for osteoporosis: Secondary | ICD-10-CM

## 2022-12-16 DIAGNOSIS — Z780 Asymptomatic menopausal state: Secondary | ICD-10-CM

## 2022-12-16 DIAGNOSIS — F17201 Nicotine dependence, unspecified, in remission: Secondary | ICD-10-CM

## 2023-01-02 ENCOUNTER — Ambulatory Visit (HOSPITAL_COMMUNITY)
Admission: RE | Admit: 2023-01-02 | Discharge: 2023-01-02 | Disposition: A | Discharge status: Home / Self Care | Payer: Medicare Other | Source: Ambulatory Visit | Attending: Family Medicine | Admitting: Family Medicine

## 2023-01-02 DIAGNOSIS — Z13820 Encounter for screening for osteoporosis: Secondary | ICD-10-CM

## 2023-01-02 DIAGNOSIS — Z780 Asymptomatic menopausal state: Secondary | ICD-10-CM | POA: Diagnosis present

## 2023-01-02 DIAGNOSIS — Z1231 Encounter for screening mammogram for malignant neoplasm of breast: Secondary | ICD-10-CM

## 2023-03-13 ENCOUNTER — Encounter (HOSPITAL_COMMUNITY)
Admission: RE | Admit: 2023-03-13 | Discharge: 2023-03-13 | Disposition: A | Discharge status: Home / Self Care | Payer: Medicare Other | Source: Ambulatory Visit | Attending: Internal Medicine | Admitting: Internal Medicine

## 2023-03-13 VITALS — BP 104/58 | HR 68 | Temp 97.6°F | Resp 20

## 2023-03-13 DIAGNOSIS — M810 Age-related osteoporosis without current pathological fracture: Secondary | ICD-10-CM | POA: Diagnosis present

## 2023-03-13 MED ORDER — DENOSUMAB 60 MG/ML ~~LOC~~ SOSY
60.0000 mg | PREFILLED_SYRINGE | Freq: Once | SUBCUTANEOUS | Status: AC
  Administered 2023-03-13: 60 mg via SUBCUTANEOUS

## 2023-03-13 NOTE — Progress Notes (Signed)
Diagnosis: Osteoporosis  Provider:  Dwana Melena MD  Procedure: Injection  Prolia (Denosumab), Dose: 60 mg, Site: subcutaneous, Number of injections: 1  Post Care: Observation period completed  Discharge: Condition: Good, Destination: Home . AVS Provided  Performed by:  Daleen Squibb, RN

## 2023-03-24 ENCOUNTER — Other Ambulatory Visit: Payer: Self-pay

## 2023-06-24 ENCOUNTER — Telehealth: Payer: Self-pay

## 2023-06-24 NOTE — Telephone Encounter (Signed)
Auth Submission: NO AUTH NEEDED Site of care: Site of care: [facility] Payer: Medicare A/B plus AARP supplement Medication & CPT/J Code(s) submitted: Prolia (Denosumab) [id number] Route of submission (phone, fax, portal):  Phone # Fax # Auth type: Buy/Bill PB Units/visits requested: 60mg  x 1 dose Reference number:  Approval from: 06/24/23 to 09/19/23

## 2023-08-28 ENCOUNTER — Telehealth: Payer: Self-pay

## 2023-08-28 NOTE — Telephone Encounter (Signed)
 Auth Submission: NO AUTH NEEDED Site of care: Site of care: [facility] Payer: Medicare A/B plus AARP supplement Medication & CPT/J Code(s) submitted: Prolia  (Denosumab ) [id number] Route of submission (phone, fax, portal):  Phone # Fax # Auth type: Buy/Bill PB Units/visits requested: 60mg  x 2 doses Reference number:  Approval from: 06/24/23 to 09/18/24

## 2023-09-11 ENCOUNTER — Telehealth: Payer: Self-pay | Admitting: Emergency Medicine

## 2023-09-11 NOTE — Telephone Encounter (Signed)
Left message for Dr Scharlene Gloss nurse for a new current order for her prolia injection.  Fax number and phone number given. She is scheduled 09/15/23.

## 2023-09-12 ENCOUNTER — Other Ambulatory Visit: Payer: Self-pay

## 2023-09-15 ENCOUNTER — Inpatient Hospital Stay (HOSPITAL_COMMUNITY): Admission: RE | Admit: 2023-09-15 | Payer: Medicare Other | Source: Ambulatory Visit

## 2023-09-15 ENCOUNTER — Ambulatory Visit: Payer: Medicare Other

## 2023-09-22 ENCOUNTER — Encounter: Payer: Medicare Other | Attending: Internal Medicine | Admitting: *Deleted

## 2023-09-22 VITALS — BP 122/60 | HR 70 | Temp 97.3°F | Resp 18

## 2023-09-22 DIAGNOSIS — M810 Age-related osteoporosis without current pathological fracture: Secondary | ICD-10-CM | POA: Insufficient documentation

## 2023-09-22 MED ORDER — DENOSUMAB 60 MG/ML ~~LOC~~ SOSY
60.0000 mg | PREFILLED_SYRINGE | Freq: Once | SUBCUTANEOUS | Status: AC
  Administered 2023-09-22: 60 mg via SUBCUTANEOUS

## 2023-09-22 NOTE — Progress Notes (Signed)
Diagnosis: Osteoporosis  Provider:  Dwana Melena MD  Procedure: Injection  Prolia (Denosumab), Dose: 60 mg, Site: subcutaneous, Number of injections: 1  Injection Site(s): Right upper quad. Gluteus right thigh  Post Care: Observation period completed  Discharge: Condition: Good, Destination: Home . AVS Provided  Performed by:  Daleen Squibb, RN

## 2024-03-24 ENCOUNTER — Encounter: Payer: Medicare Other | Attending: Internal Medicine | Admitting: *Deleted

## 2024-03-24 VITALS — BP 139/68 | HR 76 | Temp 98.2°F

## 2024-03-24 DIAGNOSIS — M810 Age-related osteoporosis without current pathological fracture: Secondary | ICD-10-CM | POA: Diagnosis present

## 2024-03-24 MED ORDER — DENOSUMAB 60 MG/ML ~~LOC~~ SOSY
60.0000 mg | PREFILLED_SYRINGE | Freq: Once | SUBCUTANEOUS | Status: AC
  Administered 2024-03-24: 60 mg via SUBCUTANEOUS

## 2024-03-24 NOTE — Progress Notes (Signed)
 Diagnosis: Osteoporosis  Provider:  Shona Salvo MD  Procedure: Injection  Prolia  (Denosumab ), Dose: 60 mg, Site: subcutaneous, Number of injections: 1  Injection Site(s): Right leg  Post Care: Observation period completed  Discharge: Condition: Good, Destination: Home . AVS Provided  Performed by:  Baldwin Darice Helling, RN

## 2024-04-28 LAB — LAB REPORT - SCANNED: EGFR: 69

## 2024-09-01 ENCOUNTER — Telehealth (HOSPITAL_COMMUNITY): Payer: Self-pay | Admitting: Pharmacist

## 2024-09-01 NOTE — Telephone Encounter (Signed)
 Received Prolia  orders via fax from Dr. Milford office however order is not signed. Left VM with Dr. Milford nurse/assistant to refax order with signature  Patient's next dose is scheduled for 09/29/2024 @ [facility]  Kerry Chisolm, PharmD, MPH, BCPS, CPP Clinical Pharmacist

## 2024-09-01 NOTE — Telephone Encounter (Signed)
 Calcium wnl on 04/28/2024 (sent to media tab)

## 2024-09-02 ENCOUNTER — Telehealth (HOSPITAL_COMMUNITY): Payer: Self-pay | Admitting: Pharmacy Technician

## 2024-09-02 ENCOUNTER — Encounter (HOSPITAL_COMMUNITY): Payer: Self-pay | Admitting: Internal Medicine

## 2024-09-02 NOTE — Telephone Encounter (Addendum)
 Auth Submission: NO AUTH NEEDED Site of care: [facility] Payer: Medicare A/B, AARP Supp   Medication & CPT/J Code(s) submitted: Prolia  (Denosumab ) [id number] Diagnosis Code: M81.0 Route of submission (phone, fax, portal):  Phone # Fax # Auth type: Buy/Bill HB Units/visits requested: 60mg  x 2 doses, q 6 months Reference number:  Approval from: 09/02/2024 to 08/18/25    Dagoberto Armour, CPhT [facility] Phone: [phone] 09/02/2024

## 2024-09-02 NOTE — Telephone Encounter (Signed)
 Signed Prolia  orders received  Asta Corbridge, PharmD, MPH, BCPS, CPP Clinical Pharmacist

## 2024-09-06 ENCOUNTER — Encounter: Payer: Self-pay | Admitting: Internal Medicine

## 2024-09-29 ENCOUNTER — Encounter (HOSPITAL_COMMUNITY)

## 2024-09-29 ENCOUNTER — Ambulatory Visit

## 2024-09-29 DIAGNOSIS — Z1231 Encounter for screening mammogram for malignant neoplasm of breast: Secondary | ICD-10-CM
